# Patient Record
Sex: Female | Born: 1970 | Race: White | Hispanic: No | Marital: Single | Smoking: Current every day smoker
Health system: Southern US, Community
[De-identification: ages and names within clinical notes are randomized; demographics above are authoritative.]

## PROBLEM LIST (undated history)

## (undated) DIAGNOSIS — R011 Cardiac murmur, unspecified: Secondary | ICD-10-CM

## (undated) HISTORY — DX: Cardiac murmur, unspecified: R01.1

---

## 1998-07-31 ENCOUNTER — Other Ambulatory Visit: Admission: RE | Admit: 1998-07-31 | Discharge: 1998-07-31 | Payer: Self-pay | Admitting: Gynecology

## 1999-12-08 ENCOUNTER — Other Ambulatory Visit: Admission: RE | Admit: 1999-12-08 | Discharge: 1999-12-08 | Payer: Self-pay | Admitting: Gynecology

## 2001-12-31 ENCOUNTER — Other Ambulatory Visit: Admission: RE | Admit: 2001-12-31 | Discharge: 2001-12-31 | Payer: Self-pay | Admitting: Obstetrics and Gynecology

## 2002-01-02 ENCOUNTER — Encounter: Admission: RE | Admit: 2002-01-02 | Discharge: 2002-01-02 | Payer: Self-pay | Admitting: Obstetrics and Gynecology

## 2002-01-02 ENCOUNTER — Encounter: Payer: Self-pay | Admitting: Obstetrics and Gynecology

## 2003-02-04 ENCOUNTER — Other Ambulatory Visit: Admission: RE | Admit: 2003-02-04 | Discharge: 2003-02-04 | Payer: Self-pay | Admitting: Obstetrics and Gynecology

## 2004-04-12 ENCOUNTER — Other Ambulatory Visit: Admission: RE | Admit: 2004-04-12 | Discharge: 2004-04-12 | Payer: Self-pay | Admitting: Gynecology

## 2004-11-02 ENCOUNTER — Ambulatory Visit: Payer: Self-pay | Admitting: Internal Medicine

## 2004-11-03 ENCOUNTER — Ambulatory Visit (HOSPITAL_COMMUNITY): Admission: RE | Admit: 2004-11-03 | Discharge: 2004-11-03 | Payer: Self-pay | Admitting: Internal Medicine

## 2005-05-16 ENCOUNTER — Other Ambulatory Visit: Admission: RE | Admit: 2005-05-16 | Discharge: 2005-05-16 | Payer: Self-pay | Admitting: Gynecology

## 2005-05-16 ENCOUNTER — Other Ambulatory Visit: Admission: RE | Admit: 2005-05-16 | Discharge: 2005-05-16 | Payer: Self-pay | Admitting: Obstetrics and Gynecology

## 2005-08-12 ENCOUNTER — Ambulatory Visit: Payer: Self-pay | Admitting: Endocrinology

## 2005-12-30 ENCOUNTER — Ambulatory Visit: Payer: Self-pay | Admitting: Endocrinology

## 2006-05-28 ENCOUNTER — Inpatient Hospital Stay (HOSPITAL_COMMUNITY): Admission: EM | Admit: 2006-05-28 | Discharge: 2006-05-30 | Payer: Self-pay | Admitting: Emergency Medicine

## 2007-07-07 ENCOUNTER — Encounter: Payer: Self-pay | Admitting: Endocrinology

## 2007-07-07 DIAGNOSIS — J309 Allergic rhinitis, unspecified: Secondary | ICD-10-CM | POA: Insufficient documentation

## 2007-07-07 DIAGNOSIS — F17200 Nicotine dependence, unspecified, uncomplicated: Secondary | ICD-10-CM | POA: Insufficient documentation

## 2007-07-07 DIAGNOSIS — Z789 Other specified health status: Secondary | ICD-10-CM | POA: Insufficient documentation

## 2011-03-09 ENCOUNTER — Encounter: Payer: Self-pay | Admitting: Internal Medicine

## 2011-03-31 ENCOUNTER — Encounter: Payer: Self-pay | Admitting: Internal Medicine

## 2011-03-31 ENCOUNTER — Ambulatory Visit (INDEPENDENT_AMBULATORY_CARE_PROVIDER_SITE_OTHER): Payer: BC Managed Care – PPO | Admitting: Internal Medicine

## 2011-03-31 ENCOUNTER — Other Ambulatory Visit (INDEPENDENT_AMBULATORY_CARE_PROVIDER_SITE_OTHER): Payer: BC Managed Care – PPO

## 2011-03-31 VITALS — BP 110/80 | HR 72 | Temp 98.8°F | Resp 16 | Ht 60.0 in | Wt 134.0 lb

## 2011-03-31 DIAGNOSIS — Z23 Encounter for immunization: Secondary | ICD-10-CM

## 2011-03-31 DIAGNOSIS — Z0000 Encounter for general adult medical examination without abnormal findings: Secondary | ICD-10-CM

## 2011-03-31 LAB — LIPID PANEL
Cholesterol: 194 mg/dL (ref 0–200)
LDL Cholesterol: 113 mg/dL — ABNORMAL HIGH (ref 0–99)
Triglycerides: 42 mg/dL (ref 0.0–149.0)
VLDL: 8.4 mg/dL (ref 0.0–40.0)

## 2011-03-31 LAB — COMPREHENSIVE METABOLIC PANEL
AST: 24 U/L (ref 0–37)
Alkaline Phosphatase: 56 U/L (ref 39–117)
BUN: 15 mg/dL (ref 6–23)
Glucose, Bld: 94 mg/dL (ref 70–99)
Sodium: 138 mEq/L (ref 135–145)
Total Bilirubin: 0.4 mg/dL (ref 0.3–1.2)
Total Protein: 7.3 g/dL (ref 6.0–8.3)

## 2011-03-31 NOTE — Progress Notes (Signed)
  Subjective:    Patient ID: Lori Hobbs, female    DOB: 1971/08/31, 40 y.o.   MRN: [id number]  HPI  New to me for a physical, she offers no complaints today.  Review of Systems  Constitutional: Negative.   HENT: Negative.   Eyes: Negative.   Respiratory: Negative.   Cardiovascular: Negative.   Gastrointestinal: Negative.   Genitourinary: Negative.   Musculoskeletal: Negative.   Skin: Negative.   Neurological: Negative.   Hematological: Negative.   Psychiatric/Behavioral: Negative.        Objective:   Physical Exam  Vitals reviewed. Constitutional: She is oriented to person, place, and time. She appears well-developed and well-nourished. No distress.  HENT:  Head: Normocephalic and atraumatic.  Right Ear: External ear normal.  Left Ear: External ear normal.  Nose: Nose normal.  Mouth/Throat: Oropharynx is clear and moist. No oropharyngeal exudate.  Eyes: Conjunctivae and EOM are normal. Pupils are equal, round, and reactive to light. Right eye exhibits no discharge. Left eye exhibits no discharge. No scleral icterus.  Neck: Normal range of motion. Neck supple. No JVD present. No tracheal deviation present. No thyromegaly present.  Cardiovascular: Normal rate, regular rhythm and intact distal pulses.  Exam reveals no gallop and no friction rub.   No murmur heard. Pulmonary/Chest: Effort normal and breath sounds normal. No stridor. No respiratory distress. She has no wheezes. She has no rales. She exhibits no tenderness.  Abdominal: Soft. Bowel sounds are normal. She exhibits no distension and no mass. There is no tenderness. There is no rebound and no guarding.  Musculoskeletal: Normal range of motion. She exhibits no edema and no tenderness.  Lymphadenopathy:    She has no cervical adenopathy.  Neurological: She is alert and oriented to person, place, and time. She has normal reflexes. She displays normal reflexes. No cranial nerve deficit. She exhibits normal muscle  tone. Coordination normal.  Skin: Skin is warm and dry. No rash noted. She is not diaphoretic. No erythema. No pallor.  Psychiatric: She has a normal mood and affect. Her behavior is normal. Judgment and thought content normal.          Assessment & Plan:

## 2011-03-31 NOTE — Patient Instructions (Signed)
Smoking Cessation This document explains the best ways for you to quit smoking and new treatments to help. It lists new medicines that can double or triple your chances of quitting and quitting for good. It also considers ways to avoid relapses and concerns you may have about quitting, including weight gain. NICOTINE: A POWERFUL ADDICTION If you have tried to quit smoking, you know how hard it can be. It is hard because nicotine is a very addictive drug. For some people, it can be as addictive as heroin or cocaine. Usually, people make 2 or 3 tries, or more, before finally being able to quit. Each time you try to quit, you can learn about what helps and what hurts. Quitting takes hard work and a lot of effort, but you can quit smoking. QUITTING SMOKING IS ONE OF THE MOST IMPORTANT THINGS YOU WILL EVER DO:  You will live longer, feel better, and live better.   The impact on your body of quitting smoking is felt almost immediately:   Within 20 minutes, blood pressure decreases. Pulse returns to its normal level.   After 8 hours, carbon monoxide levels in the blood return to normal. Oxygen level increases.   After 24 hours, chance of heart attack starts to decrease. Breath, hair, and body stop smelling like smoke.   After 48 hours, damaged nerve endings begin to recover. Sense of taste and smell improve.   After 72 hours, the body is virtually free of nicotine. Bronchial tubes relax and breathing becomes easier.   After 2 to 12 weeks, lungs can hold more air. Exercise becomes easier and circulation improves.   Quitting will lower your chance of having a heart attack, stroke, cancer, or lung disease:   After 1 year, the risk of coronary heart disease is cut in half.   After 5 years, the risk of stroke falls to the same as a nonsmoker.   After 10 years, the risk of lung cancer is cut in half and the risk of other cancers decreases significantly.   After 15 years, the risk of coronary heart  disease drops, usually to the level of a nonsmoker.   If you are pregnant, quitting smoking will improve your chances of having a healthy baby.   The people you live with, especially your children, will be healthier.   You will have extra money to spend on things other than cigarettes.  FIVE KEYS TO QUITTING Studies have shown that these 5 steps will help you quit smoking and quit for good. You have the best chances of quitting if you use them together: 1. Get ready.  2. Get support and encouragement.  3. Learn new skills and behaviors.  4. Get medicine to reduce your nicotine addiction and use it correctly.  5. Be prepared for relapse or difficult situations. Be determined to continue trying to quit, even if you do not succeed at first.  1. GET READY  Set a quit date.   Change your environment.   Get rid of ALL cigarettes, ashtrays, matches, and lighters in your home, car, and place of work.   Do not let people smoke in your home.   Review your past attempts to quit. Think about what worked and what did not.   Once you quit, do not smoke. NOT EVEN A PUFF!  2. GET SUPPORT AND ENCOURAGEMENT Studies have shown that you have a better chance of being successful if you have help. You can get support in many ways.  Tell   your family, friends, and coworkers that you are going to quit and need their support. Ask them not to smoke around you.   Talk to your caregivers (doctor, dentist, nurse, pharmacist, psychologist, and/or smoking counselor).   Get individual, group, or telephone counseling and support. The more counseling you have, the better your chances are of quitting. Programs are available at local hospitals and health centers. Call your local health department for information about programs in your area.   Spiritual beliefs and practices may help some smokers quit.   Quit meters are small computer programs online or downloadable that keep track of quit statistics, such as amount  of "quit-time," cigarettes not smoked, and money saved.   Many smokers find one or more of the many self-help books available useful in helping them quit and stay off tobacco.  3. LEARN NEW SKILLS AND BEHAVIORS  Try to distract yourself from urges to smoke. Talk to someone, go for a walk, or occupy your time with a task.   When you first try to quit, change your routine. Take a different route to work. Drink tea instead of coffee. Eat breakfast in a different place.   Do something to reduce your stress. Take a hot bath, exercise, or read a book.   Plan something enjoyable to do every day. Reward yourself for not smoking.   Explore interactive web-based programs that specialize in helping you quit.  4. GET MEDICINE AND USE IT CORRECTLY Medicines can help you stop smoking and decrease the urge to smoke. Combining medicine with the above behavioral methods and support can quadruple your chances of successfully quitting smoking. The [place] Food and Drug Administration (FDA) has approved 7 medicines to help you quit smoking. These medicines fall into 3 categories.  Nicotine replacement therapy (delivers nicotine to your body without the negative effects and risks of smoking):   Nicotine gum: Available over-the-counter.   Nicotine lozenges: Available over-the-counter.   Nicotine inhaler: Available by prescription.   Nicotine nasal spray: Available by prescription.   Nicotine skin patches (transdermal): Available by prescription and over-the-counter.   Antidepressant medicine (helps people abstain from smoking, but how this works is unknown):   Bupropion sustained-release (SR) tablets: Available by prescription.   Nicotinic receptor partial agonist (simulates the effect of nicotine in your brain):   Varenicline tartrate tablets: Available by prescription.   Ask your caregiver for advice about which medicines to use and how to use them. Carefully read the information on the package.    Everyone who is trying to quit may benefit from using a medicine. If you are pregnant or trying to become pregnant, nursing an infant, you are under age 18, or you smoke fewer than 10 cigarettes per day, talk to your caregiver before taking any nicotine replacement medicines.   You should stop using a nicotine replacement product and call your caregiver if you experience nausea, dizziness, weakness, vomiting, fast or irregular heartbeat, mouth problems with the lozenge or gum, or redness or swelling of the skin around the patch that does not go away.   Do not use any other product containing nicotine while using a nicotine replacement product.   Talk to your caregiver before using these products if you have diabetes, heart disease, asthma, stomach ulcers, you had a recent heart attack, you have high blood pressure that is not controlled with medicine, a history of irregular heartbeat, or you have been prescribed medicine to help you quit smoking.  5. BE PREPARED FOR RELAPSE OR   DIFFICULT SITUATIONS  Most relapses occur within the first 3 months after quitting. Do not be discouraged if you start smoking again. Remember, most people try several times before they finally quit.   You may have symptoms of withdrawal because your body is used to nicotine. You may crave cigarettes, be irritable, feel very hungry, cough often, get headaches, or have difficulty concentrating.   The withdrawal symptoms are only temporary. They are strongest when you first quit, but they will go away within 10 to 14 days.  Here are some difficult situations to watch for:  Alcohol. Avoid drinking alcohol. Drinking lowers your chances of successfully quitting.   Caffeine. Try to reduce the amount of caffeine you consume. It also lowers your chances of successfully quitting.   Other smokers. Being around smoking can make you want to smoke. Avoid smokers.   Weight gain. Many smokers will gain weight when they quit, usually  less than 10 pounds. Eat a healthy diet and stay active. Do not let weight gain distract you from your main goal, quitting smoking. Some medicines that help you quit smoking may also help delay weight gain. You can always lose the weight gained after you quit.   Bad mood or depression. There are a lot of ways to improve your mood other than smoking.  If you are having problems with any of these situations, talk to your caregiver. SPECIAL SITUATIONS OR CONDITIONS Studies suggest that everyone can quit smoking. Your situation or condition can give you a special reason to quit.  Pregnant women/New mothers: By quitting, you protect your baby's health and your own.   Hospitalized patients: By quitting, you reduce health problems and help healing.   Heart attack patients: By quitting, you reduce your risk of a second heart attack.   Lung, head, and neck cancer patients: By quitting, you reduce your chance of a second cancer.   Parents of children and adolescents: By quitting, you protect your children from illnesses caused by secondhand smoke.  QUESTIONS TO THINK ABOUT Think about the following questions before you try to stop smoking. You may want to talk about your answers with your caregiver.  Why do you want to quit?   If you tried to quit in the past, what helped and what did not?   What will be the most difficult situations for you after you quit? How will you plan to handle them?   Who can help you through the tough times? Your family? Friends? Caregiver?   What pleasures do you get from smoking? What ways can you still get pleasure if you quit?  Here are some questions to ask your caregiver:  How can you help me to be successful at quitting?   What medicine do you think would be best for me and how should I take it?   What should I do if I need more help?   What is smoking withdrawal like? How can I get information on withdrawal?  Quitting takes hard work and a lot of effort,  but you can quit smoking. FOR MORE INFORMATION Smokefree.gov (http://www.smokefree.gov) provides free, accurate, evidence-based information and professional assistance to help support the immediate and long-term needs of people trying to quit smoking. Document Released: 08/30/2001 Document Re-Released: 02/23/2010 [employer] Patient Information 2011 [employer]. 

## 2011-03-31 NOTE — Assessment & Plan Note (Signed)
Labs ordered an pt ed material was given

## 2012-03-12 ENCOUNTER — Emergency Department (HOSPITAL_COMMUNITY): Payer: Self-pay

## 2012-03-12 ENCOUNTER — Encounter (HOSPITAL_COMMUNITY): Payer: Self-pay

## 2012-03-12 ENCOUNTER — Emergency Department (INDEPENDENT_AMBULATORY_CARE_PROVIDER_SITE_OTHER): Payer: BC Managed Care – PPO

## 2012-03-12 ENCOUNTER — Emergency Department (INDEPENDENT_AMBULATORY_CARE_PROVIDER_SITE_OTHER)
Admission: EM | Admit: 2012-03-12 | Discharge: 2012-03-12 | Disposition: A | Discharge status: Home / Self Care | Payer: BC Managed Care – PPO | Source: Home / Self Care

## 2012-03-12 DIAGNOSIS — M79609 Pain in unspecified limb: Secondary | ICD-10-CM

## 2012-03-12 DIAGNOSIS — M79643 Pain in unspecified hand: Secondary | ICD-10-CM

## 2012-03-12 NOTE — ED Provider Notes (Signed)
Lori Hobbs is a 41 y.o. female who presents to Urgent Care today for right hand pain. Patient works at [employer]. On Saturday she was trying to pull a heavy object when her hand slipped and the dorsal aspect of her thumb hit a metal shelf with force. She has had continued pain at the base of her thumb since this injury. She denies any swelling numbness or weakness.   PMH reviewed. Smoker History  Substance Use Topics  . Smoking status: Current Everyday Smoker -- 1.0 packs/day for 20 years    Types: Cigarettes  . Smokeless tobacco: Not on file  . Alcohol Use: 12.6 oz/week    21 Cans of beer per week   ROS as above Medications reviewed. No current facility-administered medications for this encounter.   Current Outpatient Prescriptions  Medication Sig Dispense Refill  . MedroxyPROGESTERone Acetate (DEPO-PROVERA IM) Inject into the muscle.          Exam:  BP 155/100  Pulse 90  Temp 97.9 F (36.6 C) (Oral)  Resp 18  SpO2 100% Gen: Well NAD Right Hand: Normal appearing. Mildly tender at the base of the volar thumb. Normal hand motion, and strength.   No results found for this or any previous visit (from the past 24 hour(s)). Dg Wrist Complete Right  03/12/2012  *RADIOLOGY REPORT*  Clinical Data: wrist pain, injury  RIGHT WRIST - COMPLETE 3+ VIEW  Comparison: None.  Findings: Normal alignment without fracture.  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  Preserved joint spaces.  No significant arthropathy or degenerative process.  no soft tissue swelling.  IMPRESSION: No acute finding.  Original Report Authenticated By: Judie Petit. Ruel Favors, M.D.    Assessment and Plan: 41 y.o. female with bruise. Plan to return to work. If still having pain after a 1 week recommend follow up with orthopedics. Recommend tylenol or ibuprofen as needed for pain. Discussed warning signs or symptoms. Please see discharge instructions. Patient expresses understanding.      Rodolph Bong,  MD 03/12/12 2036

## 2012-03-12 NOTE — ED Provider Notes (Signed)
Medical screening examination/treatment/procedure(s) were performed by non-physician practitioner and as supervising physician I was immediately available for consultation/collaboration.  Leslee Home, M.D.   Reuben Likes, MD 03/12/12 2051

## 2012-03-12 NOTE — ED Notes (Signed)
Pt states she injured her rt wrist/base of rt thumb at work on Saturday night.  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  She informed her manager Rober Minion at the time.  Today she was sent here by Gerarda Fraction.  C/o pain at the base of rt thumb/rt wrist area.

## 2012-03-12 NOTE — Discharge Instructions (Signed)
Thank you for coming in today. If you still have pain in 1 week please follow up with an orthopedic doctor.  Take tylenol or ibuprofen as needed for pain.  Apply ice for 10 mins 2x a day as needed for pain.

## 2012-09-05 ENCOUNTER — Other Ambulatory Visit: Payer: Self-pay | Admitting: Obstetrics and Gynecology

## 2012-09-05 DIAGNOSIS — R928 Other abnormal and inconclusive findings on diagnostic imaging of breast: Secondary | ICD-10-CM

## 2012-09-17 ENCOUNTER — Other Ambulatory Visit: Payer: Self-pay | Admitting: Obstetrics and Gynecology

## 2012-09-17 ENCOUNTER — Ambulatory Visit
Admission: RE | Admit: 2012-09-17 | Discharge: 2012-09-17 | Disposition: A | Discharge status: Home / Self Care | Payer: BC Managed Care – PPO | Source: Ambulatory Visit | Attending: Obstetrics and Gynecology | Admitting: Obstetrics and Gynecology

## 2012-09-17 DIAGNOSIS — R921 Mammographic calcification found on diagnostic imaging of breast: Secondary | ICD-10-CM

## 2012-09-17 DIAGNOSIS — R928 Other abnormal and inconclusive findings on diagnostic imaging of breast: Secondary | ICD-10-CM

## 2012-09-27 ENCOUNTER — Other Ambulatory Visit: Payer: Self-pay | Admitting: Obstetrics & Gynecology

## 2012-09-27 DIAGNOSIS — R921 Mammographic calcification found on diagnostic imaging of breast: Secondary | ICD-10-CM

## 2012-09-28 ENCOUNTER — Other Ambulatory Visit: Payer: Self-pay | Admitting: Obstetrics & Gynecology

## 2012-09-28 ENCOUNTER — Ambulatory Visit
Admission: RE | Admit: 2012-09-28 | Discharge: 2012-09-28 | Disposition: A | Discharge status: Home / Self Care | Payer: BC Managed Care – PPO | Source: Ambulatory Visit | Attending: Obstetrics & Gynecology | Admitting: Obstetrics & Gynecology

## 2012-09-28 ENCOUNTER — Ambulatory Visit
Admission: RE | Admit: 2012-09-28 | Discharge: 2012-09-28 | Disposition: A | Discharge status: Home / Self Care | Payer: BC Managed Care – PPO | Source: Ambulatory Visit | Attending: Obstetrics and Gynecology | Admitting: Obstetrics and Gynecology

## 2012-09-28 DIAGNOSIS — R921 Mammographic calcification found on diagnostic imaging of breast: Secondary | ICD-10-CM

## 2013-04-26 ENCOUNTER — Encounter: Payer: Self-pay | Admitting: Internal Medicine

## 2013-04-26 ENCOUNTER — Ambulatory Visit (INDEPENDENT_AMBULATORY_CARE_PROVIDER_SITE_OTHER): Payer: BC Managed Care – PPO | Admitting: Internal Medicine

## 2013-04-26 ENCOUNTER — Other Ambulatory Visit (INDEPENDENT_AMBULATORY_CARE_PROVIDER_SITE_OTHER): Payer: BC Managed Care – PPO

## 2013-04-26 VITALS — BP 110/80 | HR 79 | Temp 98.7°F | Resp 16 | Ht 60.0 in | Wt 119.0 lb

## 2013-04-26 DIAGNOSIS — Z23 Encounter for immunization: Secondary | ICD-10-CM

## 2013-04-26 DIAGNOSIS — Z0000 Encounter for general adult medical examination without abnormal findings: Secondary | ICD-10-CM

## 2013-04-26 LAB — CBC WITH DIFFERENTIAL/PLATELET
Eosinophils Relative: 5.1 % — ABNORMAL HIGH (ref 0.0–5.0)
HCT: 42.1 % (ref 36.0–46.0)
Lymphs Abs: 2.5 10*3/uL (ref 0.7–4.0)
MCV: 98.1 fl (ref 78.0–100.0)
Monocytes Absolute: 0.6 10*3/uL (ref 0.1–1.0)
Platelets: 225 10*3/uL (ref 150.0–400.0)
RDW: 12.4 % (ref 11.5–14.6)

## 2013-04-26 LAB — LIPID PANEL
HDL: 70.4 mg/dL (ref >39.00)
Total CHOL/HDL Ratio: 2
VLDL: 7.6 mg/dL (ref 0.0–40.0)

## 2013-04-26 LAB — COMPREHENSIVE METABOLIC PANEL
ALT: 16 U/L (ref 0–35)
AST: 19 U/L (ref 0–37)
Alkaline Phosphatase: 47 U/L (ref 39–117)
Sodium: 141 mEq/L (ref 135–145)
Total Bilirubin: 0.6 mg/dL (ref 0.3–1.2)
Total Protein: 6.5 g/dL (ref 6.0–8.3)

## 2013-04-26 NOTE — Patient Instructions (Signed)
Preventive Care for Adults, Female A healthy lifestyle and preventive care can promote health and wellness. Preventive health guidelines for women include the following key practices.  A routine yearly physical is a good way to check with your caregiver about your health and preventive screening. It is a chance to share any concerns and updates on your health, and to receive a thorough exam.  Visit your dentist for a routine exam and preventive care every 6 months. Brush your teeth twice a day and floss once a day. Good oral hygiene prevents tooth decay and gum disease.  The frequency of eye exams is based on your age, health, family medical history, use of contact lenses, and other factors. Follow your caregiver's recommendations for frequency of eye exams.  Eat a healthy diet. Foods like vegetables, fruits, whole grains, low-fat dairy products, and lean protein foods contain the nutrients you need without too many calories. Decrease your intake of foods high in solid fats, added sugars, and salt. Eat the right amount of calories for you.Get information about a proper diet from your caregiver, if necessary.  Regular physical exercise is one of the most important things you can do for your health. Most adults should get at least 150 minutes of moderate-intensity exercise (any activity that increases your heart rate and causes you to sweat) each week. In addition, most adults need muscle-strengthening exercises on 2 or more days a week.  Maintain a healthy weight. The body mass index (BMI) is a screening tool to identify possible weight problems. It provides an estimate of body fat based on height and weight. Your caregiver can help determine your BMI, and can help you achieve or maintain a healthy weight.For adults 20 years and older:  A BMI below 18.5 is considered underweight.  A BMI of 18.5 to 24.9 is normal.  A BMI of 25 to 29.9 is considered overweight.  A BMI of 30 and above is  considered obese.  Maintain normal blood lipids and cholesterol levels by exercising and minimizing your intake of saturated fat. Eat a balanced diet with plenty of fruit and vegetables. Blood tests for lipids and cholesterol should begin at age 20 and be repeated every 5 years. If your lipid or cholesterol levels are high, you are over 50, or you are at high risk for heart disease, you may need your cholesterol levels checked more frequently.Ongoing high lipid and cholesterol levels should be treated with medicines if diet and exercise are not effective.  If you smoke, find out from your caregiver how to quit. If you do not use tobacco, do not start.  If you are pregnant, do not drink alcohol. If you are breastfeeding, be very cautious about drinking alcohol. If you are not pregnant and choose to drink alcohol, do not exceed 1 drink per day. One drink is considered to be 12 ounces (355 mL) of beer, 5 ounces (148 mL) of wine, or 1.5 ounces (44 mL) of liquor.  Avoid use of street drugs. Do not share needles with anyone. Ask for help if you need support or instructions about stopping the use of drugs.  High blood pressure causes heart disease and increases the risk of stroke. Your blood pressure should be checked at least every 1 to 2 years. Ongoing high blood pressure should be treated with medicines if weight loss and exercise are not effective.  If you are 55 to 43 years old, ask your caregiver if you should take aspirin to prevent strokes.  Diabetes   screening involves taking a blood sample to check your fasting blood sugar level. This should be done once every 3 years, after age 45, if you are within normal weight and without risk factors for diabetes. Testing should be considered at a younger age or be carried out more frequently if you are overweight and have at least 1 risk factor for diabetes.  Breast cancer screening is essential preventive care for women. You should practice "breast  self-awareness." This means understanding the normal appearance and feel of your breasts and may include breast self-examination. Any changes detected, no matter how small, should be reported to a caregiver. Women in their 20s and 30s should have a clinical breast exam (CBE) by a caregiver as part of a regular health exam every 1 to 3 years. After age 40, women should have a CBE every year. Starting at age 40, women should consider having a mammography (breast X-ray test) every year. Women who have a family history of breast cancer should talk to their caregiver about genetic screening. Women at a high risk of breast cancer should talk to their caregivers about having magnetic resonance imaging (MRI) and a mammography every year.  The Pap test is a screening test for cervical cancer. A Pap test can show cell changes on the cervix that might become cervical cancer if left untreated. A Pap test is a procedure in which cells are obtained and examined from the lower end of the uterus (cervix).  Women should have a Pap test starting at age 21.  Between ages 21 and 29, Pap tests should be repeated every 2 years.  Beginning at age 30, you should have a Pap test every 3 years as long as the past 3 Pap tests have been normal.  Some women have medical problems that increase the chance of getting cervical cancer. Talk to your caregiver about these problems. It is especially important to talk to your caregiver if a new problem develops soon after your last Pap test. In these cases, your caregiver may recommend more frequent screening and Pap tests.  The above recommendations are the same for women who have or have not gotten the vaccine for human papillomavirus (HPV).  If you had a hysterectomy for a problem that was not cancer or a condition that could lead to cancer, then you no longer need Pap tests. Even if you no longer need a Pap test, a regular exam is a good idea to make sure no other problems are  starting.  If you are between ages 65 and 70, and you have had normal Pap tests going back 10 years, you no longer need Pap tests. Even if you no longer need a Pap test, a regular exam is a good idea to make sure no other problems are starting.  If you have had past treatment for cervical cancer or a condition that could lead to cancer, you need Pap tests and screening for cancer for at least 20 years after your treatment.  If Pap tests have been discontinued, risk factors (such as a new sexual partner) need to be reassessed to determine if screening should be resumed.  The HPV test is an additional test that may be used for cervical cancer screening. The HPV test looks for the virus that can cause the cell changes on the cervix. The cells collected during the Pap test can be tested for HPV. The HPV test could be used to screen women aged 30 years and older, and should   be used in women of any age who have unclear Pap test results. After the age of 30, women should have HPV testing at the same frequency as a Pap test.  Colorectal cancer can be detected and often prevented. Most routine colorectal cancer screening begins at the age of 50 and continues through age 75. However, your caregiver may recommend screening at an earlier age if you have risk factors for colon cancer. On a yearly basis, your caregiver may provide home test kits to check for hidden blood in the stool. Use of a small camera at the end of a tube, to directly examine the colon (sigmoidoscopy or colonoscopy), can detect the earliest forms of colorectal cancer. Talk to your caregiver about this at age 50, when routine screening begins. Direct examination of the colon should be repeated every 5 to 10 years through age 75, unless early forms of pre-cancerous polyps or small growths are found.  Hepatitis C blood testing is recommended for all people born from 1945 through 1965 and any individual with known risks for hepatitis C.  Practice  safe sex. Use condoms and avoid high-risk sexual practices to reduce the spread of sexually transmitted infections (STIs). STIs include gonorrhea, chlamydia, syphilis, trichomonas, herpes, HPV, and human immunodeficiency virus (HIV). Herpes, HIV, and HPV are viral illnesses that have no cure. They can result in disability, cancer, and death. Sexually active women aged 25 and younger should be checked for chlamydia. Older women with new or multiple partners should also be tested for chlamydia. Testing for other STIs is recommended if you are sexually active and at increased risk.  Osteoporosis is a disease in which the bones lose minerals and strength with aging. This can result in serious bone fractures. The risk of osteoporosis can be identified using a bone density scan. Women ages 65 and over and women at risk for fractures or osteoporosis should discuss screening with their caregivers. Ask your caregiver whether you should take a calcium supplement or vitamin D to reduce the rate of osteoporosis.  Menopause can be associated with physical symptoms and risks. Hormone replacement therapy is available to decrease symptoms and risks. You should talk to your caregiver about whether hormone replacement therapy is right for you.  Use sunscreen with sun protection factor (SPF) of 30 or more. Apply sunscreen liberally and repeatedly throughout the day. You should seek shade when your shadow is shorter than you. Protect yourself by wearing long sleeves, pants, a wide-brimmed hat, and sunglasses year round, whenever you are outdoors.  Once a month, do a whole body skin exam, using a mirror to look at the skin on your back. Notify your caregiver of new moles, moles that have irregular borders, moles that are larger than a pencil eraser, or moles that have changed in shape or color.  Stay current with required immunizations.  Influenza. You need a dose every fall (or winter). The composition of the flu vaccine  changes each year, so being vaccinated once is not enough.  Pneumococcal polysaccharide. You need 1 to 2 doses if you smoke cigarettes or if you have certain chronic medical conditions. You need 1 dose at age 65 (or older) if you have never been vaccinated.  Tetanus, diphtheria, pertussis (Tdap, Td). Get 1 dose of Tdap vaccine if you are younger than age 65, are over 65 and have contact with an infant, are a healthcare worker, are pregnant, or simply want to be protected from whooping cough. After that, you need a Td   booster dose every 10 years. Consult your caregiver if you have not had at least 3 tetanus and diphtheria-containing shots sometime in your life or have a deep or dirty wound.  HPV. You need this vaccine if you are a woman age 26 or younger. The vaccine is given in 3 doses over 6 months.  Measles, mumps, rubella (MMR). You need at least 1 dose of MMR if you were born in 1957 or later. You may also need a second dose.  Meningococcal. If you are age 19 to 21 and a first-year college student living in a residence hall, or have one of several medical conditions, you need to get vaccinated against meningococcal disease. You may also need additional booster doses.  Zoster (shingles). If you are age 60 or older, you should get this vaccine.  Varicella (chickenpox). If you have never had chickenpox or you were vaccinated but received only 1 dose, talk to your caregiver to find out if you need this vaccine.  Hepatitis A. You need this vaccine if you have a specific risk factor for hepatitis A virus infection or you simply wish to be protected from this disease. The vaccine is usually given as 2 doses, 6 to 18 months apart.  Hepatitis B. You need this vaccine if you have a specific risk factor for hepatitis B virus infection or you simply wish to be protected from this disease. The vaccine is given in 3 doses, usually over 6 months. Preventive Services / Frequency Ages 19 to 39  Blood  pressure check.** / Every 1 to 2 years.  Lipid and cholesterol check.** / Every 5 years beginning at age 20.  Clinical breast exam.** / Every 3 years for women in their 20s and 30s.  Pap test.** / Every 2 years from ages 21 through 29. Every 3 years starting at age 30 through age 65 or 70 with a history of 3 consecutive normal Pap tests.  HPV screening.** / Every 3 years from ages 30 through ages 65 to 70 with a history of 3 consecutive normal Pap tests.  Hepatitis C blood test.** / For any individual with known risks for hepatitis C.  Skin self-exam. / Monthly.  Influenza immunization.** / Every year.  Pneumococcal polysaccharide immunization.** / 1 to 2 doses if you smoke cigarettes or if you have certain chronic medical conditions.  Tetanus, diphtheria, pertussis (Tdap, Td) immunization. / A one-time dose of Tdap vaccine. After that, you need a Td booster dose every 10 years.  HPV immunization. / 3 doses over 6 months, if you are 26 and younger.  Measles, mumps, rubella (MMR) immunization. / You need at least 1 dose of MMR if you were born in 1957 or later. You may also need a second dose.  Meningococcal immunization. / 1 dose if you are age 19 to 21 and a first-year college student living in a residence hall, or have one of several medical conditions, you need to get vaccinated against meningococcal disease. You may also need additional booster doses.  Varicella immunization.** / Consult your caregiver.  Hepatitis A immunization.** / Consult your caregiver. 2 doses, 6 to 18 months apart.  Hepatitis B immunization.** / Consult your caregiver. 3 doses usually over 6 months. Ages 40 to 64  Blood pressure check.** / Every 1 to 2 years.  Lipid and cholesterol check.** / Every 5 years beginning at age 20.  Clinical breast exam.** / Every year after age 40.  Mammogram.** / Every year beginning at age 40   and continuing for as long as you are in good health. Consult with your  caregiver.  Pap test.** / Every 3 years starting at age 30 through age 65 or 70 with a history of 3 consecutive normal Pap tests.  HPV screening.** / Every 3 years from ages 30 through ages 65 to 70 with a history of 3 consecutive normal Pap tests.  Fecal occult blood test (FOBT) of stool. / Every year beginning at age 50 and continuing until age 75. You may not need to do this test if you get a colonoscopy every 10 years.  Flexible sigmoidoscopy or colonoscopy.** / Every 5 years for a flexible sigmoidoscopy or every 10 years for a colonoscopy beginning at age 50 and continuing until age 75.  Hepatitis C blood test.** / For all people born from 1945 through 1965 and any individual with known risks for hepatitis C.  Skin self-exam. / Monthly.  Influenza immunization.** / Every year.  Pneumococcal polysaccharide immunization.** / 1 to 2 doses if you smoke cigarettes or if you have certain chronic medical conditions.  Tetanus, diphtheria, pertussis (Tdap, Td) immunization.** / A one-time dose of Tdap vaccine. After that, you need a Td booster dose every 10 years.  Measles, mumps, rubella (MMR) immunization. / You need at least 1 dose of MMR if you were born in 1957 or later. You may also need a second dose.  Varicella immunization.** / Consult your caregiver.  Meningococcal immunization.** / Consult your caregiver.  Hepatitis A immunization.** / Consult your caregiver. 2 doses, 6 to 18 months apart.  Hepatitis B immunization.** / Consult your caregiver. 3 doses, usually over 6 months. Ages 65 and over  Blood pressure check.** / Every 1 to 2 years.  Lipid and cholesterol check.** / Every 5 years beginning at age 20.  Clinical breast exam.** / Every year after age 40.  Mammogram.** / Every year beginning at age 40 and continuing for as long as you are in good health. Consult with your caregiver.  Pap test.** / Every 3 years starting at age 30 through age 65 or 70 with a 3  consecutive normal Pap tests. Testing can be stopped between 65 and 70 with 3 consecutive normal Pap tests and no abnormal Pap or HPV tests in the past 10 years.  HPV screening.** / Every 3 years from ages 30 through ages 65 or 70 with a history of 3 consecutive normal Pap tests. Testing can be stopped between 65 and 70 with 3 consecutive normal Pap tests and no abnormal Pap or HPV tests in the past 10 years.  Fecal occult blood test (FOBT) of stool. / Every year beginning at age 50 and continuing until age 75. You may not need to do this test if you get a colonoscopy every 10 years.  Flexible sigmoidoscopy or colonoscopy.** / Every 5 years for a flexible sigmoidoscopy or every 10 years for a colonoscopy beginning at age 50 and continuing until age 75.  Hepatitis C blood test.** / For all people born from 1945 through 1965 and any individual with known risks for hepatitis C.  Osteoporosis screening.** / A one-time screening for women ages 65 and over and women at risk for fractures or osteoporosis.  Skin self-exam. / Monthly.  Influenza immunization.** / Every year.  Pneumococcal polysaccharide immunization.** / 1 dose at age 65 (or older) if you have never been vaccinated.  Tetanus, diphtheria, pertussis (Tdap, Td) immunization. / A one-time dose of Tdap vaccine if you are over   65 and have contact with an infant, are a healthcare worker, or simply want to be protected from whooping cough. After that, you need a Td booster dose every 10 years.  Varicella immunization.** / Consult your caregiver.  Meningococcal immunization.** / Consult your caregiver.  Hepatitis A immunization.** / Consult your caregiver. 2 doses, 6 to 18 months apart.  Hepatitis B immunization.** / Check with your caregiver. 3 doses, usually over 6 months. ** Family history and personal history of risk and conditions may change your caregiver's recommendations. Document Released: 11/01/2001 Document Revised: 11/28/2011  Document Reviewed: 01/31/2011 [employer] Patient Information 2014 [employer].  

## 2013-04-26 NOTE — Assessment & Plan Note (Signed)
She is not ready to stop smoking Exam done Labs ordered Pneumovax was given in light of her tobacco abuse Pt ed material was given

## 2013-04-26 NOTE — Progress Notes (Signed)
  Subjective:    Patient ID: Lori Hobbs, female    DOB: 1971-02-06, 42 y.o.   MRN: [id number]  HPI  She returns for a physical and tells me that she feels well and offers no complaints.  Review of Systems  All other systems reviewed and are negative.       Objective:   Physical Exam  Vitals reviewed. Constitutional: She is oriented to person, place, and time. She appears well-developed and well-nourished. No distress.  HENT:  Head: Normocephalic and atraumatic.  Mouth/Throat: Oropharynx is clear and moist. No oropharyngeal exudate.  Eyes: Conjunctivae are normal. Right eye exhibits no discharge. Left eye exhibits no discharge. No scleral icterus.  Neck: Normal range of motion. Neck supple. No JVD present. No tracheal deviation present. No thyromegaly present.  Cardiovascular: Normal rate, regular rhythm, normal heart sounds and intact distal pulses.  Exam reveals no gallop and no friction rub.   No murmur heard. Pulmonary/Chest: Effort normal and breath sounds normal. No stridor. No respiratory distress. She has no wheezes. She has no rales. She exhibits no tenderness.  Abdominal: Soft. Bowel sounds are normal. She exhibits no distension and no mass. There is no tenderness. There is no rebound and no guarding.  Musculoskeletal: Normal range of motion. She exhibits no edema and no tenderness.  Lymphadenopathy:    She has no cervical adenopathy.  Neurological: She is oriented to person, place, and time.  Skin: Skin is warm and dry. No rash noted. She is not diaphoretic. No erythema. No pallor.  Psychiatric: She has a normal mood and affect. Her behavior is normal. Judgment and thought content normal.     Lab Results  Component Value Date   GLUCOSE 94 03/31/2011   CHOL 194 03/31/2011   TRIG 42.0 03/31/2011   HDL 72.90 03/31/2011   LDLCALC 113* 03/31/2011   ALT 22 03/31/2011   AST 24 03/31/2011   NA 138 03/31/2011   K 4.6 03/31/2011   CL 100 03/31/2011   CREATININE 1.0 03/31/2011    BUN 15 03/31/2011   CO2 24 03/31/2011       Assessment & Plan:

## 2013-08-05 ENCOUNTER — Telehealth: Payer: Self-pay | Admitting: *Deleted

## 2013-08-05 NOTE — Telephone Encounter (Signed)
Pt called requesting anxiety medication called in due to recent death in family.  Please advise

## 2013-08-05 NOTE — Telephone Encounter (Signed)
Patient informed of what PCP decided.

## 2013-08-05 NOTE — Telephone Encounter (Signed)
I don't prescribe pills for grief

## 2014-08-29 ENCOUNTER — Other Ambulatory Visit: Payer: Self-pay | Admitting: Obstetrics & Gynecology

## 2014-09-01 LAB — CYTOLOGY - PAP

## 2015-10-15 ENCOUNTER — Other Ambulatory Visit: Payer: Self-pay | Admitting: Obstetrics & Gynecology

## 2015-10-15 DIAGNOSIS — N644 Mastodynia: Secondary | ICD-10-CM

## 2015-10-19 ENCOUNTER — Ambulatory Visit
Admission: RE | Admit: 2015-10-19 | Discharge: 2015-10-19 | Disposition: A | Discharge status: Home / Self Care | Payer: BLUE CROSS/BLUE SHIELD | Source: Ambulatory Visit | Attending: Obstetrics & Gynecology | Admitting: Obstetrics & Gynecology

## 2015-10-19 DIAGNOSIS — N644 Mastodynia: Secondary | ICD-10-CM

## 2017-06-20 ENCOUNTER — Telehealth: Payer: Self-pay | Admitting: Internal Medicine

## 2017-06-20 NOTE — Telephone Encounter (Signed)
Rec'd from [facility] forward 3 pages to Dr. Sanda Linger

## 2017-07-02 IMAGING — MG MM DIAG BREAST TOMO BILATERAL
6 of 10 series · 6 of 30 positions shown · non-contrast
Comparison: Previous exam(s).

CLINICAL DATA: Recent pain in the 6 o'clock region the left breast.
The patient does not palpate any lumps. The pain is not present
today.

EXAM:
DIGITAL DIAGNOSTIC BILATERAL MAMMOGRAM WITH 3D TOMOSYNTHESIS AND CAD

[L MLO (1 of 2)]
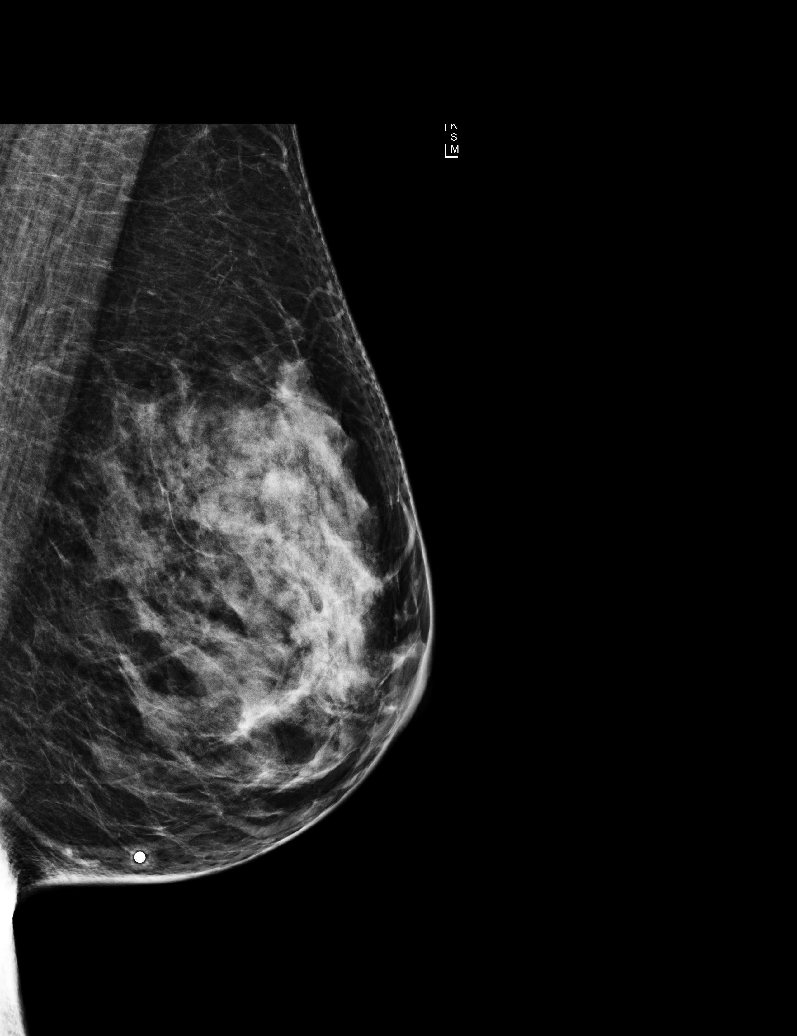

[R CC]
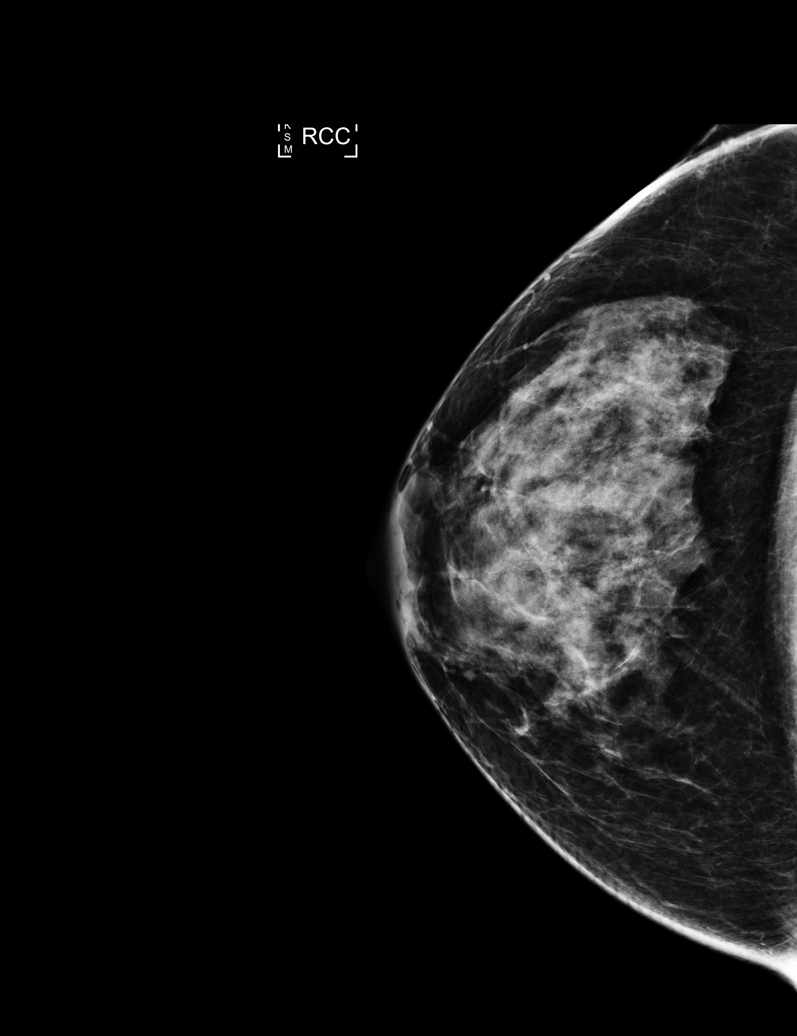

[L CC]
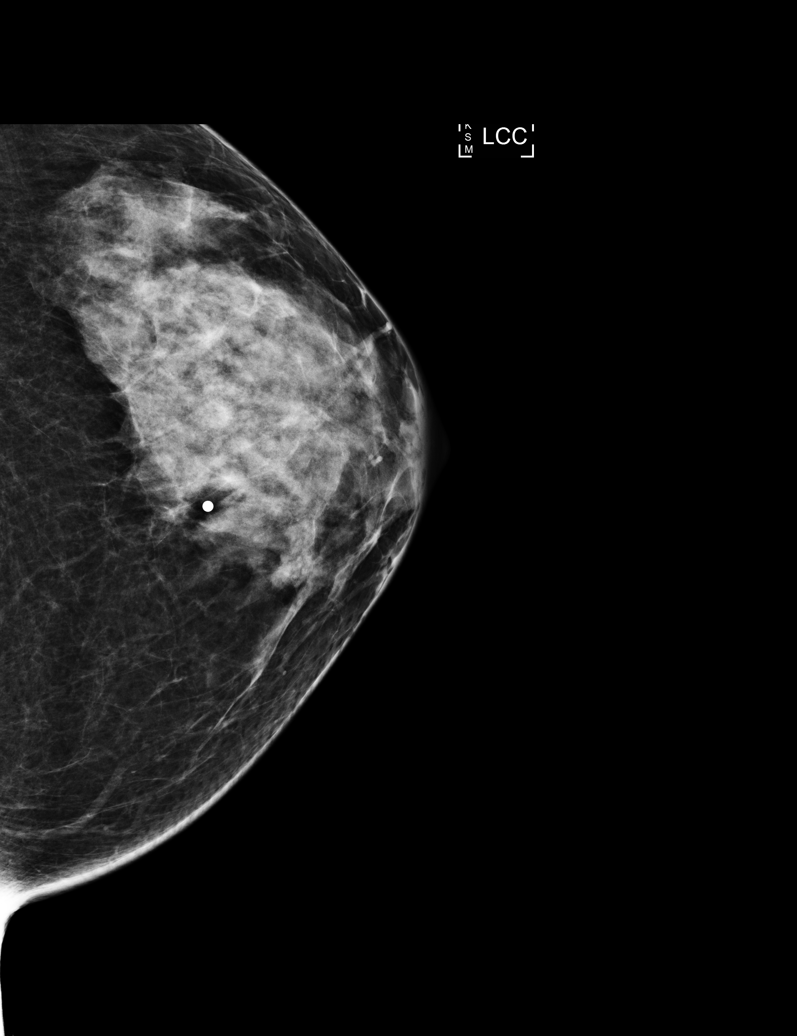

[L MLO (2 of 2)]
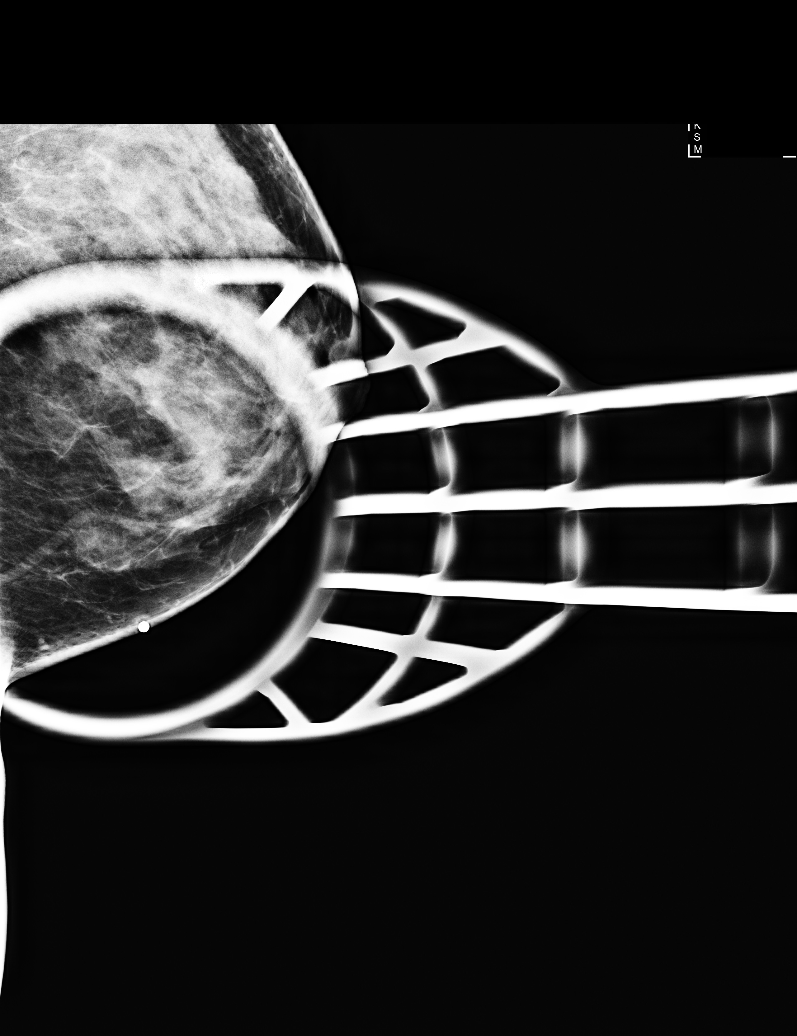

[R MLO]
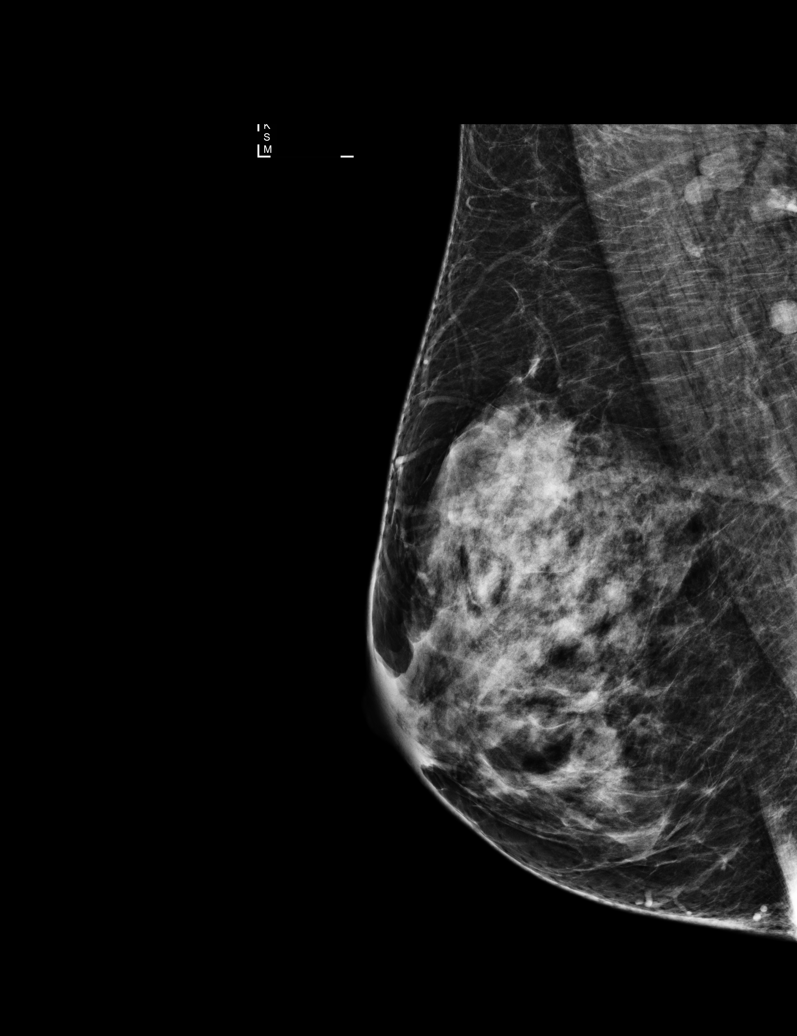

[L MLO tomo · tomo slice 33/64.0]
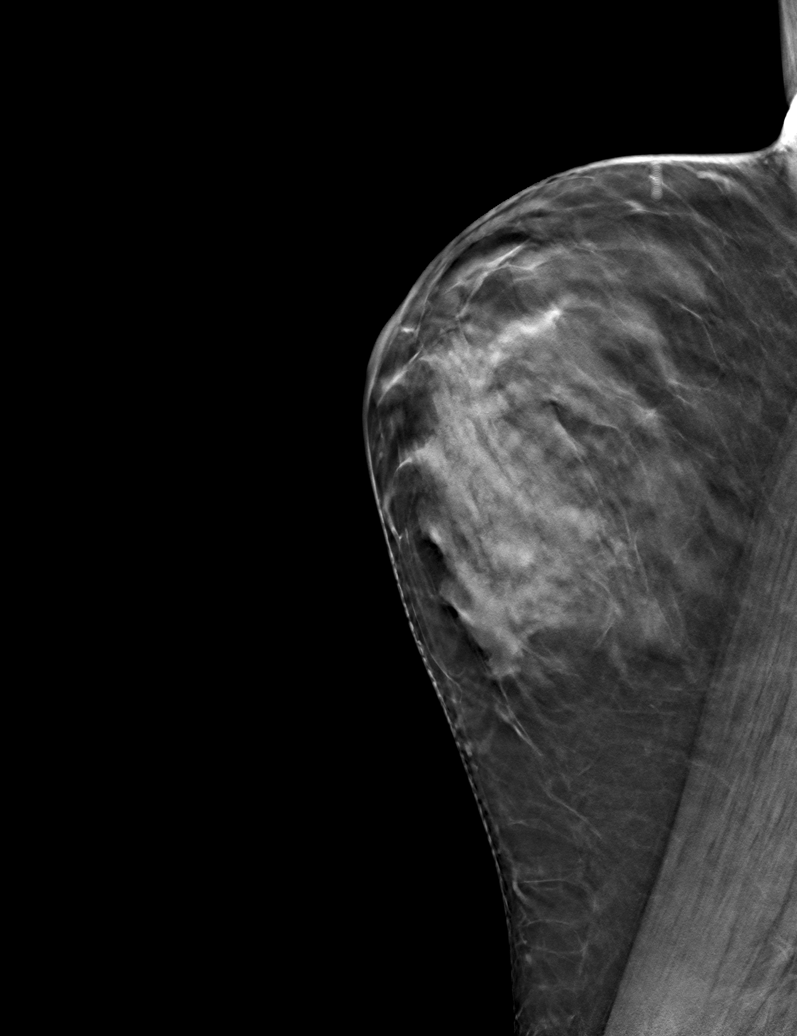

[6 of 30 positions shown; findings below may reference images not displayed]

ACR Breast Density Category c: The breast tissue is heterogeneously
dense, which may obscure small masses.
FINDINGS: No mass, distortion, or suspicious microcalcification is identified
in either breast to suggest malignancy. A skin BB was placed in the
region of recent patient pain and spot tangential tomographic images
were performed. There are no suspicious findings in this region of
the left breast.

Mammographic images were processed with CAD.
IMPRESSION: No evidence of malignancy in either breast.

RECOMMENDATION:
Screening mammogram in one year.(Code:[id number]-Q-XFG)

I have discussed the findings and recommendations with the patient.
Results were also provided in writing at the conclusion of the
visit. If applicable, a reminder letter will be sent to the patient
regarding the next appointment.

BI-RADS CATEGORY  1: Negative.

## 2018-06-04 ENCOUNTER — Telehealth: Payer: Self-pay | Admitting: Internal Medicine

## 2018-06-04 NOTE — Telephone Encounter (Signed)
Rec'd from [facility] forwarded 3 pages to Dr. Yetta BarreJones

## 2024-04-18 LAB — HM DEXA SCAN

## 2024-06-07 NOTE — Progress Notes (Unsigned)
 LILLETTE Ileana Collet, PhD, LAT, ATC acting as a scribe for Artist Lloyd, MD.  [name] is a 53 y.o. female who presents to [facility] at [facility] today for osteoporosis management. Family hx of osteoporosis.  DEXA scan (date, T-score): 04/18/24: Spine= -2.8, L-FN= -2.5, R-FN= -2.6 Prior treatment: none History of Hip, Spine, or Wrist Fx: no Heart disease or stroke: no Cancer: no, family hx Kidney Disease: no Gastric/Peptic Ulcer: no Gastric bypass surgery: no Severe GERD: no, just heartburn Hx of seizures: no Age at Menopause: early 6's Calcium intake: none Vitamin D  intake: none Hormone replacement therapy: IUD Smoking history: former smoker, 72yrs. Currently uses e-cigarettes Alcohol: yes- 5-6 drinks/day Exercise: just activity at work Major dental work in past year: no Parents with hip/spine fracture: yes: Lori Hobbs, spine and hip Height loss: 61 to 60.5  Patient is drinking 2 or more drinks most evenings.   Pertinent review of systems: No fevers or chills  Relevant historical information: Family history of osteoporosis.  Social determinants of health: Lori Hobbs has significant caregiver burden.  Her Lori Hobbs is currently in a nursing home on hospice care for the last 6 months after being treated for lung cancer and then having a variety of fractures culminating in a hip fracture that was unsuccessfully rehabbed.  Her Lori Hobbs requires a great deal of care and attention.   Exam:  BP 106/72   Pulse 100   Ht 5' (1.524 m)   Wt 141 lb (64 kg)   SpO2 99%   BMI 27.54 kg/m  General: Well Developed, well nourished, and in no acute distress.   MSK: Normal Gait  Psych: Alert and oriented normal speech thought process and affect.     06/13/2024   11:11 AM 04/26/2013   12:32 PM  Depression screen PHQ 2/9  Decreased Interest 0 0  Down, Depressed, Hopeless 0 0  PHQ - 2 Score 0 0  Altered sleeping 1   Tired, decreased energy 1   Change in appetite 2   Feeling  bad or failure about yourself  0   Trouble concentrating 0   Moving slowly or fidgety/restless 0   Suicidal thoughts 0   PHQ-9 Score 4   Difficult doing work/chores Not difficult at all          Assessment and Plan: 53 y.o. female with osteoporosis.  This occurs in the setting of a family history of significant osteoporosis and at a young age only 4.  Her care is complicated by significant caregiver burden.  I do not think it is very feasible for her to dramatically increase exercise at this time.  I think a good goal would be try to get a walk twice a week.  Will go ahead and check metabolic panel and vitamin D .  We spent time talking about treatment options.  Based on her advanced osteoporosis for her age and family history of severe complications from osteoporosis and her young age we will initiate authorization for anabolic agent.  After discussion we will consider Evenity.   We also spent a fair amount of time talking about caregiver fatigue and burden.  I did screen her for depression which she did screen negative.  I wonder if she is having more stress than she is revealing in clinic today.  Stressed the importance of making time for herself.  We also spent a little bit of time talking about alcohol use.  I think she is using a little more than she should  and advised her to try to cut back or talk to her loved ones about her alcohol use.   PDMP not reviewed this encounter. Orders Placed This Encounter  Procedures   Comprehensive metabolic panel with GFR    Osteoporis    Standing Status:   Future    Number of Occurrences:   1    Expiration Date:   06/13/2025   Magnesium    Therapeutic drug monitoring    Standing Status:   Future    Number of Occurrences:   1    Expiration Date:   06/13/2025   Phosphorus    Osteroporis    Standing Status:   Future    Number of Occurrences:   1    Expiration Date:   06/13/2025   VITAMIN D  25 Hydroxy (Vit-D Deficiency, Fractures)     Osteoporis    Standing Status:   Future    Number of Occurrences:   1    Expiration Date:   06/13/2025   No orders of the defined types were placed in this encounter.    Discussed warning signs or symptoms. Please see discharge instructions. Patient expresses understanding.   The above documentation has been reviewed and is accurate and complete Artist Lloyd, M.D.

## 2024-06-13 ENCOUNTER — Telehealth: Payer: Self-pay

## 2024-06-13 ENCOUNTER — Ambulatory Visit: Admitting: Family Medicine

## 2024-06-13 VITALS — BP 106/72 | HR 100 | Ht 60.0 in | Wt 141.0 lb

## 2024-06-13 DIAGNOSIS — M810 Age-related osteoporosis without current pathological fracture: Secondary | ICD-10-CM

## 2024-06-13 DIAGNOSIS — Z636 Dependent relative needing care at home: Secondary | ICD-10-CM | POA: Diagnosis not present

## 2024-06-13 LAB — COMPREHENSIVE METABOLIC PANEL WITH GFR
ALT: 13 U/L (ref 0–35)
AST: 23 U/L (ref 0–37)
Albumin: 4.7 g/dL (ref 3.5–5.2)
Alkaline Phosphatase: 79 U/L (ref 39–117)
BUN: 10 mg/dL (ref 6–23)
CO2: 32 meq/L (ref 19–32)
Calcium: 9.7 mg/dL (ref 8.4–10.5)
Chloride: 98 meq/L (ref 96–112)
Creatinine, Ser: 0.75 mg/dL (ref 0.40–1.20)
GFR: 90.66 mL/min (ref >60.00)
Glucose, Bld: 101 mg/dL — ABNORMAL HIGH (ref 70–99)
Potassium: 4.2 meq/L (ref 3.5–5.1)
Sodium: 138 meq/L (ref 135–145)
Total Bilirubin: 0.4 mg/dL (ref 0.2–1.2)
Total Protein: 8.1 g/dL (ref 6.0–8.3)

## 2024-06-13 LAB — MAGNESIUM: Magnesium: 2 mg/dL (ref 1.5–2.5)

## 2024-06-13 LAB — VITAMIN D 25 HYDROXY (VIT D DEFICIENCY, FRACTURES): VITD: 20.56 ng/mL — ABNORMAL LOW (ref 30.00–100.00)

## 2024-06-13 LAB — PHOSPHORUS: Phosphorus: 4.7 mg/dL — ABNORMAL HIGH (ref 2.3–4.6)

## 2024-06-13 NOTE — Patient Instructions (Addendum)
 Thank you for coming in today.   We will work to get your started on Evenity  Please get labs today before you leave   Try to go walk and time time for yourself at least twice a week  Keep a look out for alcohol intake  Re-establish primary care with Dr. Norleen upstairs

## 2024-06-13 NOTE — Telephone Encounter (Signed)
 Evenity ordered through the [facility]. Labs-- pending

## 2024-06-13 NOTE — Telephone Encounter (Signed)
 [facility]  Prolia VOB initiated via [email or web address]  Next Prolia inj DUE: new start

## 2024-06-14 ENCOUNTER — Telehealth: Payer: Self-pay

## 2024-06-14 ENCOUNTER — Ambulatory Visit: Payer: Self-pay | Admitting: Family Medicine

## 2024-06-14 DIAGNOSIS — R7989 Other specified abnormal findings of blood chemistry: Secondary | ICD-10-CM

## 2024-06-14 MED ORDER — VITAMIN D (ERGOCALCIFEROL) 1.25 MG (50000 UNIT) PO CAPS: 50000.0000 [IU] | ORAL_CAPSULE | ORAL | 3 refills | Status: AC

## 2024-06-14 NOTE — Progress Notes (Signed)
 Vitamin D  level is quite low at 20.  I have prescribed vitamin D  pills to take 1 pill once per week.  Medication sent to the [facility] on [address].  Additionally phosphorus is just a little bit elevated.  Calcium level looks okay and kidney function looks okay.  I have added a another test to check into the elevated phosphorus.  At your convenience please return to my office to get blood drawn.

## 2024-06-14 NOTE — Telephone Encounter (Signed)
 Prior auth pending by Fax - form not in Latent

## 2024-06-17 NOTE — Telephone Encounter (Signed)
 Benefits undisclosed

## 2024-06-18 NOTE — Telephone Encounter (Signed)
 Dr. Joane, I submitted a prior authorization for this patient and it was denied. She must try a bisphosphonate such as alendronate before the insurance will approve Evenity. Please let me know how you want to proceed and I will close this referral if needed. The denial letter will be in the media tab of the patients chart.  Thank you, Arohi Salvatierra

## 2024-06-20 ENCOUNTER — Other Ambulatory Visit

## 2024-06-20 DIAGNOSIS — R7989 Other specified abnormal findings of blood chemistry: Secondary | ICD-10-CM

## 2024-06-20 NOTE — Telephone Encounter (Signed)
 [employer] and left VM requesting benefit/coverage information for Evenity [id number]). Requested co-pay, co-insurance, deductible, and OOP Max information as well as prior authorization requirements and best way to obtain authorization. Left VM to call back with requested info.

## 2024-06-20 NOTE — Telephone Encounter (Signed)
 Layton, Krista R, CPhT    06/18/24  2:07 PM Note Dr. Joane, I submitted a prior authorization for this patient and it was denied. She must try a bisphosphonate such as alendronate before the insurance will approve Evenity. Please let me know how you want to proceed and I will close this referral if needed. The denial letter will be in the media tab of the patients chart.  Thank you, Krista

## 2024-06-21 NOTE — Telephone Encounter (Signed)
 Dr. Joane, Evenity was denied. Needs trial/failure or intolerance to Bisphosphonate.

## 2024-06-21 NOTE — Telephone Encounter (Signed)
 Evenity Coverage - call ref # [id number] Co-insurance: 30% Copay: n/a Deductible (family HSA): $124.56 of $3,700 met OOP Max: $124.56 of $10,000 (family) / $9,200 (individual) PRIOR AUTH REQUIRED - [phone] Referral NOT required Lifetime max unlimited No there insurance

## 2024-06-22 LAB — PTH, INTACT AND CALCIUM
Calcium: 9.5 mg/dL (ref 8.6–10.4)
PTH: 27 pg/mL (ref 16–77)

## 2024-06-24 ENCOUNTER — Ambulatory Visit: Payer: Self-pay | Admitting: Family Medicine

## 2024-06-24 NOTE — Progress Notes (Signed)
 Follow-up parathyroid hormone level and ionized calcium looks normal.  Continue vitamin D 

## 2024-06-28 NOTE — Telephone Encounter (Signed)
 Lets try the alendronate and see if she cannot tolerated or can tolerate it.

## 2024-07-01 MED ORDER — ALENDRONATE SODIUM 70 MG PO TABS: 70.0000 mg | ORAL_TABLET | ORAL | 3 refills | Status: AC

## 2024-07-01 NOTE — Addendum Note (Signed)
 Addended by: MARDY LEOTIS RAMAN on: 07/01/2024 11:41 AM   Modules accepted: Orders
